# Patient Record
Sex: Male | Born: 1980 | Race: Black or African American | Hispanic: No | Marital: Single | Smoking: Current every day smoker
Health system: Southern US, Community
[De-identification: ages and names within clinical notes are randomized; demographics above are authoritative.]

## PROBLEM LIST (undated history)

## (undated) DIAGNOSIS — K08409 Partial loss of teeth, unspecified cause, unspecified class: Secondary | ICD-10-CM

---
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
## 2003-11-15 ENCOUNTER — Emergency Department (HOSPITAL_COMMUNITY): Admission: AD | Admit: 2003-11-15 | Discharge: 2003-11-16 | Payer: Self-pay | Admitting: Emergency Medicine

## 2004-03-24 ENCOUNTER — Emergency Department (HOSPITAL_COMMUNITY): Admission: EM | Admit: 2004-03-24 | Discharge: 2004-03-24 | Payer: Self-pay

## 2010-06-13 ENCOUNTER — Emergency Department (HOSPITAL_COMMUNITY): Admission: EM | Admit: 2010-06-13 | Discharge: 2010-06-13 | Payer: Self-pay | Admitting: Family Medicine

## 2011-01-08 LAB — GC/CHLAMYDIA PROBE AMP, GENITAL
Chlamydia, DNA Probe: NEGATIVE
GC Probe Amp, Genital: NEGATIVE

## 2011-04-30 ENCOUNTER — Emergency Department (HOSPITAL_COMMUNITY)
Admission: EM | Admit: 2011-04-30 | Discharge: 2011-04-30 | Disposition: A | Discharge status: Home / Self Care | Payer: Self-pay | Attending: Emergency Medicine | Admitting: Emergency Medicine

## 2011-04-30 DIAGNOSIS — S335XXA Sprain of ligaments of lumbar spine, initial encounter: Secondary | ICD-10-CM | POA: Insufficient documentation

## 2011-04-30 DIAGNOSIS — M545 Low back pain, unspecified: Secondary | ICD-10-CM | POA: Insufficient documentation

## 2011-04-30 DIAGNOSIS — X503XXA Overexertion from repetitive movements, initial encounter: Secondary | ICD-10-CM | POA: Insufficient documentation

## 2011-07-01 ENCOUNTER — Emergency Department (HOSPITAL_COMMUNITY): Payer: Self-pay

## 2011-07-01 ENCOUNTER — Emergency Department (HOSPITAL_COMMUNITY)
Admission: EM | Admit: 2011-07-01 | Discharge: 2011-07-02 | Disposition: A | Discharge status: Home / Self Care | Payer: Self-pay | Attending: Emergency Medicine | Admitting: Emergency Medicine

## 2011-07-01 DIAGNOSIS — M79609 Pain in unspecified limb: Secondary | ICD-10-CM | POA: Insufficient documentation

## 2011-07-01 DIAGNOSIS — S62329A Displaced fracture of shaft of unspecified metacarpal bone, initial encounter for closed fracture: Secondary | ICD-10-CM | POA: Insufficient documentation

## 2011-07-01 DIAGNOSIS — R609 Edema, unspecified: Secondary | ICD-10-CM | POA: Insufficient documentation

## 2011-07-01 DIAGNOSIS — W2209XA Striking against other stationary object, initial encounter: Secondary | ICD-10-CM | POA: Insufficient documentation

## 2012-01-12 ENCOUNTER — Ambulatory Visit: Payer: Self-pay | Admitting: Psychology

## 2012-09-27 ENCOUNTER — Encounter (HOSPITAL_COMMUNITY): Payer: Self-pay | Admitting: *Deleted

## 2012-09-27 ENCOUNTER — Emergency Department (HOSPITAL_COMMUNITY)
Admission: EM | Admit: 2012-09-27 | Discharge: 2012-09-27 | Disposition: A | Discharge status: Home / Self Care | Payer: Self-pay | Attending: Emergency Medicine | Admitting: Emergency Medicine

## 2012-09-27 DIAGNOSIS — B029 Zoster without complications: Secondary | ICD-10-CM | POA: Insufficient documentation

## 2012-09-27 DIAGNOSIS — F17200 Nicotine dependence, unspecified, uncomplicated: Secondary | ICD-10-CM | POA: Insufficient documentation

## 2012-09-27 DIAGNOSIS — Z7982 Long term (current) use of aspirin: Secondary | ICD-10-CM | POA: Insufficient documentation

## 2012-09-27 MED ORDER — ACYCLOVIR 800 MG PO TABS: 800.0000 mg | ORAL_TABLET | Freq: Every day | ORAL | Status: DC

## 2012-09-27 MED ORDER — OXYCODONE-ACETAMINOPHEN 5-325 MG PO TABS: 2.0000 | ORAL_TABLET | ORAL | Status: DC | PRN

## 2012-09-27 NOTE — ED Notes (Signed)
Pt reports having a knot in his groin region and now has a rash that extends across his groin and into his (L) buttock.  Pt reports itching and pain.  Pt reports that he got some prescription cream from a friend and has been using it-reports that the cream has made it worse.

## 2012-09-27 NOTE — ED Provider Notes (Signed)
History     CSN: [id number]  Arrival date & time 09/27/12  0900   First MD Initiated Contact with Patient 09/27/12 0915      Chief Complaint  Patient presents with  . Rash    (Consider location/radiation/quality/duration/timing/severity/associated sxs/prior treatment) HPI Comments: Zachory Mangual 31 y.o. male   The chief complaint is: Patient presents with:   Rash    Patient presents with Rash. Onset 3 days ago on his back. Began as a small ithcy, painful group of bumps.  The rash has spread and now wrapped down around the front of his right thigh. It is also present on the medial thigh.  He states that  It weeps. Patient is 78 and has had the chickenpox as a child.    Denies contacts with similar,changes in lotions/soaps/detergents, exposure to animal or plant irritants, and denies purulent discharge.  Denies fevers, chills, myalgias, arthralgias. Denies DOE, SOB, chest tightness or pressure, radiation to left arm, jaw or back, or diaphoresis. Denies dysuria, flank pain, suprapubic pain, frequency, urgency, or hematuria. Denies headaches, light headedness, weakness, visual disturbances. Denies abdominal pain, nausea, vomiting, diarrhea or constipation.        Patient is a 31 y.o. male presenting with rash. The history is provided by the patient. No language interpreter was used.  Rash  This is a new problem. The current episode started more than 2 days ago. The problem has been gradually worsening. There has been no fever. The rash is present on the back, right upper leg and groin. The pain is at a severity of 6/10. The pain is severe. The pain has been worsening since onset. Associated symptoms include blisters, itching, pain and weeping.    History reviewed. No pertinent past medical history.  History reviewed. No pertinent past surgical history.  History reviewed. No pertinent family history.  History  Substance Use Topics  . Smoking status: Current Every Day  Smoker -- 0.5 packs/day    Types: Cigarettes  . Smokeless tobacco: Not on file  . Alcohol Use: Yes     Comment: socially      Review of Systems  Skin: Positive for itching and rash.  With associated painful weeping blisters. Ten systems are reviewed and are negative for acute change except as noted in the HPI   Allergies  Review of patient's allergies indicates no known allergies.  Home Medications   Current Outpatient Rx  Name  Route  Sig  Dispense  Refill  . TRIAMCINOLONE ACETONIDE 0.1 % EX CREA   Topical   Apply 1 application topically 2 (two) times daily as needed. For irritation         . VITAMINS A & D EX OINT   Topical   Apply 1 application topically as needed. For irritation         . ACYCLOVIR 800 MG PO TABS   Oral   Take 1 tablet (800 mg total) by mouth 5 (five) times daily.   50 tablet   0   . OXYCODONE-ACETAMINOPHEN 5-325 MG PO TABS   Oral   Take 2 tablets by mouth every 4 (four) hours as needed for pain.   20 tablet   0     BP 126/81  Pulse 88  Temp 98.2 F (36.8 C)  Resp 18  SpO2 96%  Physical Exam Physical Exam  Nursing note and vitals reviewed. Constitutional: He appears well-developed and well-nourished. No distress.  HENT:  Head: Normocephalic and atraumatic.  Eyes: Conjunctivae normal are  normal. No scleral icterus.  Neck: Normal range of motion. Neck supple.  Cardiovascular: Normal rate, regular rhythm and normal heart sounds.   Pulmonary/Chest: Effort normal and breath sounds normal. No respiratory distress.  Abdominal: Soft. There is no tenderness.  Musculoskeletal: He exhibits no edema.  Neurological: He is alert.  Skin: Skin is warm and dry. He is not diaphoretic. Positive for vesicles/confluent bullae, crusting, weeping rash in the L1-L4 distribution from the midback to the  right thigh. Psychiatric: His behavior is normal.     ED Course  Procedures (including critical care time)  Labs Reviewed - No data to  display No results found.   1. Shingles outbreak       MDM  BP 126/81  Pulse 88  Temp 98.2 F (36.8 C)  Resp 18  SpO2 96% Patient with PE and hx consistent with herpes zoster.  I will treat with pain control and acyclovir. There does not appear to be secondary bacterial infection.  I have warned the patient of these symptoms and prevention of spread including pregnant females and elderly. Patient will return in 2 days for wound check.  Discussed reasons to seek immediate care. Patient expresses understanding and agrees with plan.         Arthor Captain, PA-C 09/29/12 [phone]

## 2012-09-27 NOTE — ED Notes (Addendum)
Pt presents to department for evaluation of rash to R buttock, lower back and R inner thigh area. Ongoing x1 week. Pt states severe itching and discomfort to area. Also states "blisters" with clear drainage. Pt states use of friend's prescription cream, but no relief from pain. He is alert and oriented x4. No signs of acute distress noted.

## 2012-10-13 NOTE — ED Provider Notes (Signed)
Medical screening examination/treatment/procedure(s) were performed by non-physician practitioner and as supervising physician I was immediately available for consultation/collaboration.  Tobin Chad, MD 10/13/12 [phone]

## 2013-03-28 IMAGING — CR DG HAND COMPLETE 3+V*R*
3 series · 3 of 3 positions shown · non-contrast
Comparison: None.

CLINICAL DATA: Right hand pain around the fifth metacarpal after
striking [place] with fist.

RIGHT HAND - COMPLETE 3+ VIEW

[x hand pa right]
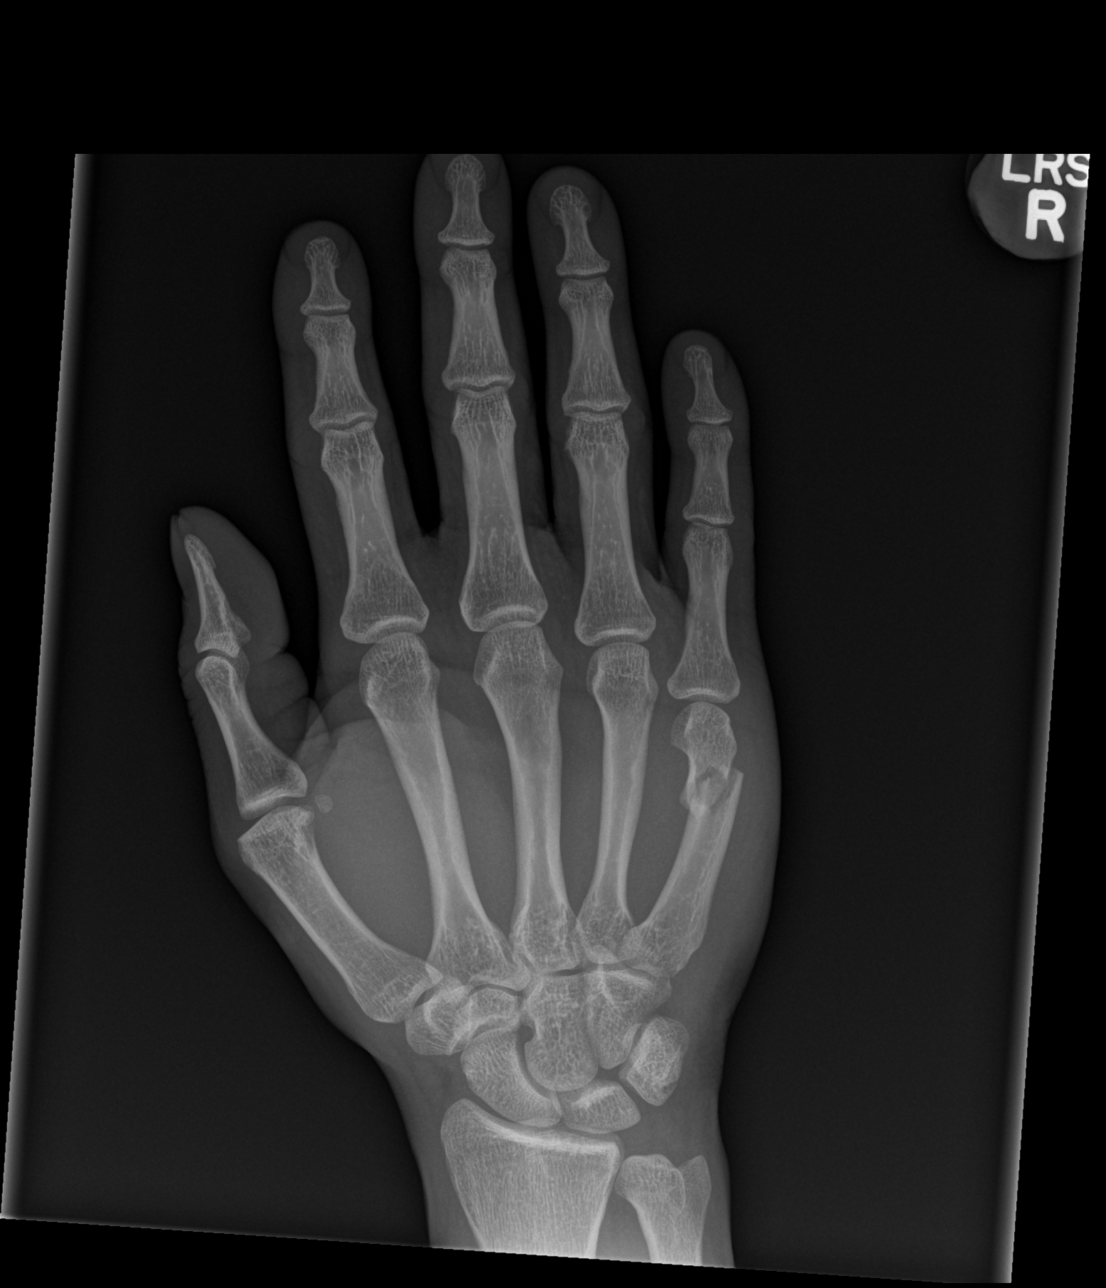

[x hand obl right]
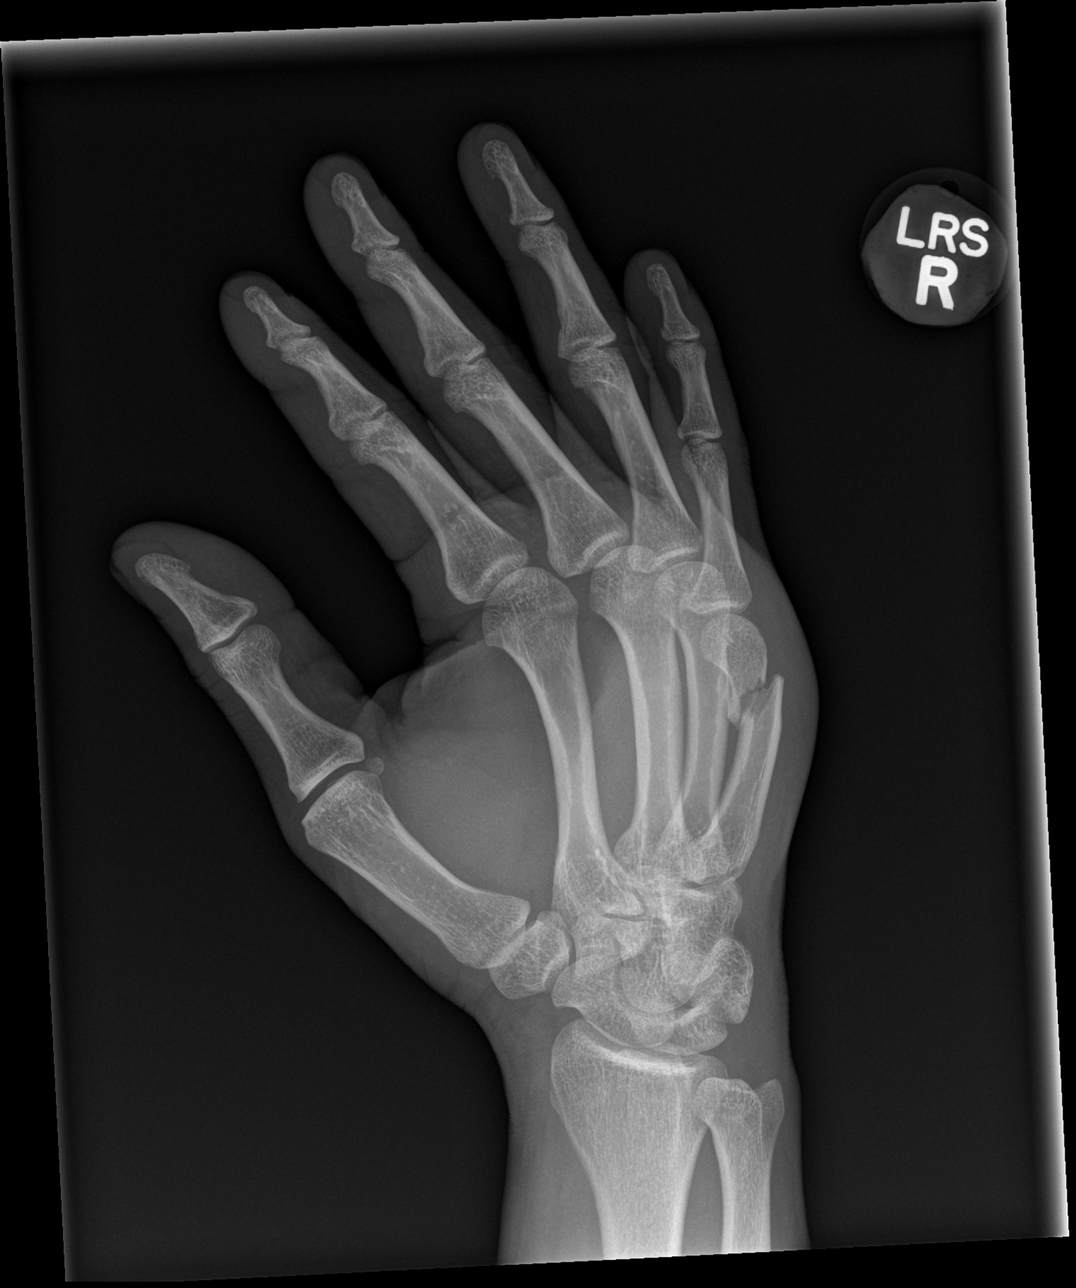

[x hand lat right]
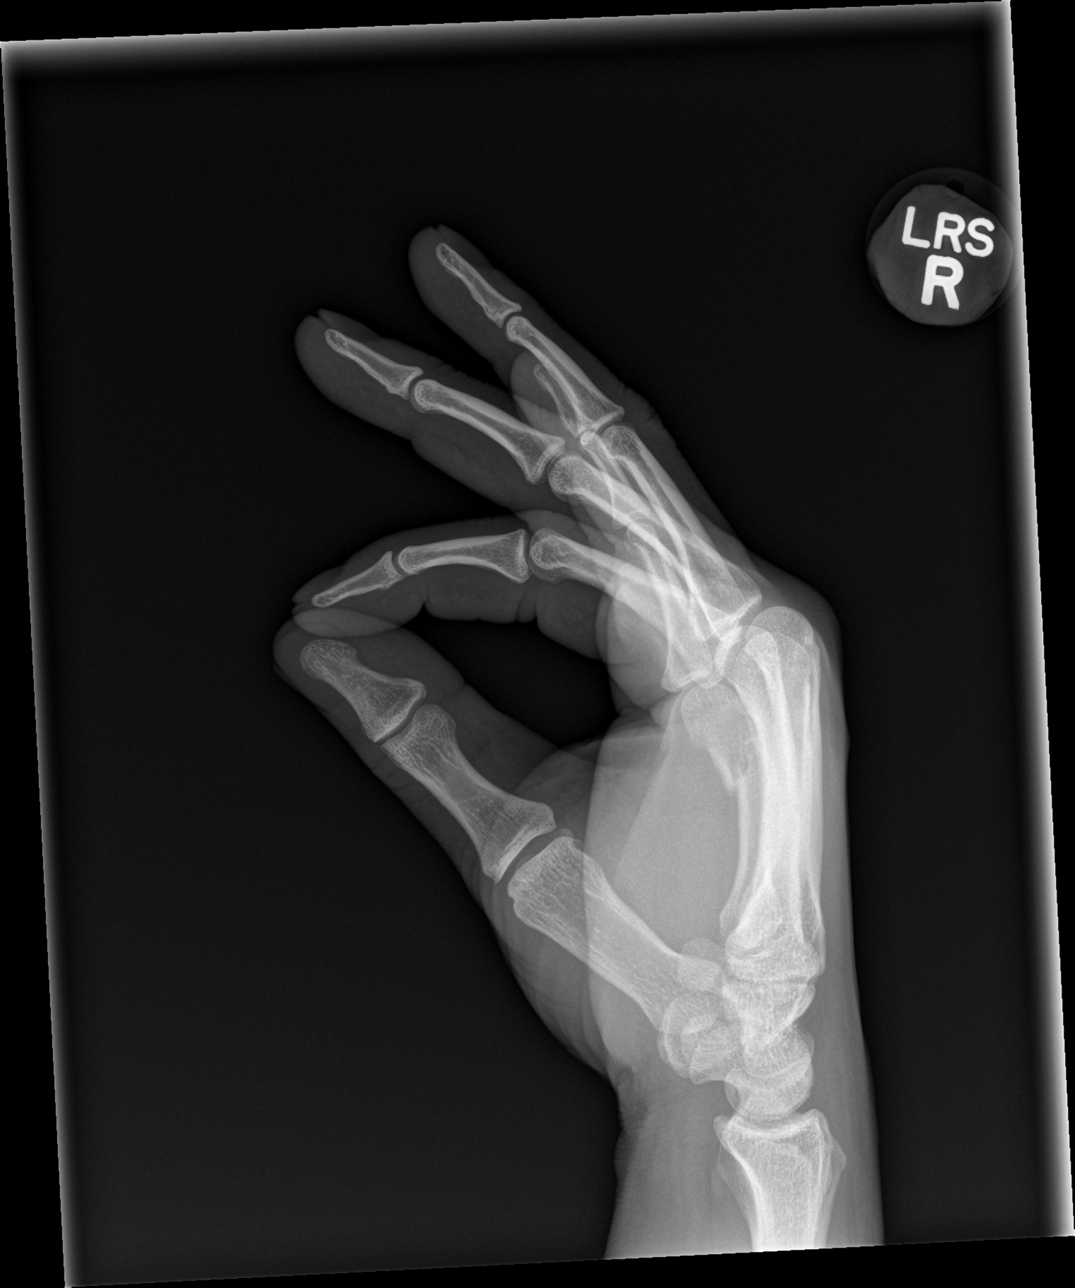

[3 of 3 positions shown; findings below may reference images not displayed]

FINDINGS: Minimally oblique fracture of the distal right fifth
metacarpal shaft with mild volar displacement and angulation.
Overlying soft tissue swelling.  No other fractures demonstrated.
No focal bone lesions.
IMPRESSION: Acute boxer's type fracture of the right fifth metacarpal bone.

## 2018-10-02 ENCOUNTER — Emergency Department (HOSPITAL_COMMUNITY)
Admission: EM | Admit: 2018-10-02 | Discharge: 2018-10-02 | Disposition: A | Discharge status: Home / Self Care | Payer: Self-pay | Attending: Emergency Medicine | Admitting: Emergency Medicine

## 2018-10-02 ENCOUNTER — Emergency Department (HOSPITAL_COMMUNITY): Payer: Self-pay

## 2018-10-02 ENCOUNTER — Encounter (HOSPITAL_COMMUNITY): Payer: Self-pay | Admitting: Emergency Medicine

## 2018-10-02 DIAGNOSIS — W230XXA Caught, crushed, jammed, or pinched between moving objects, initial encounter: Secondary | ICD-10-CM | POA: Insufficient documentation

## 2018-10-02 DIAGNOSIS — Y998 Other external cause status: Secondary | ICD-10-CM | POA: Insufficient documentation

## 2018-10-02 DIAGNOSIS — F17210 Nicotine dependence, cigarettes, uncomplicated: Secondary | ICD-10-CM | POA: Insufficient documentation

## 2018-10-02 DIAGNOSIS — Y929 Unspecified place or not applicable: Secondary | ICD-10-CM | POA: Insufficient documentation

## 2018-10-02 DIAGNOSIS — Z79899 Other long term (current) drug therapy: Secondary | ICD-10-CM | POA: Insufficient documentation

## 2018-10-02 DIAGNOSIS — S62346A Nondisplaced fracture of base of fifth metacarpal bone, right hand, initial encounter for closed fracture: Secondary | ICD-10-CM | POA: Insufficient documentation

## 2018-10-02 DIAGNOSIS — S62342A Nondisplaced fracture of base of third metacarpal bone, right hand, initial encounter for closed fracture: Secondary | ICD-10-CM | POA: Insufficient documentation

## 2018-10-02 DIAGNOSIS — Y939 Activity, unspecified: Secondary | ICD-10-CM | POA: Insufficient documentation

## 2018-10-02 MED ORDER — IBUPROFEN 800 MG PO TABS: 800.0000 mg | ORAL_TABLET | Freq: Three times a day (TID) | ORAL | 0 refills | Status: AC

## 2018-10-02 MED ORDER — IBUPROFEN 800 MG PO TABS
800.0000 mg | ORAL_TABLET | Freq: Once | ORAL | Status: AC
Start: 2018-10-02 — End: 2018-10-02
  Administered 2018-10-02: 800 mg via ORAL
  Filled 2018-10-02: qty 1

## 2018-10-02 NOTE — ED Triage Notes (Signed)
Patient presents with left hand pain/swelling injured yesterday from a car door , skin intact /pain increases with movement /palpation .

## 2018-10-02 NOTE — Discharge Instructions (Addendum)
1. Medications: alternate ibuprofen and tylenol for pain control, usual home medications °2. Treatment: rest, ice, elevate and use brace, drink plenty of fluids, gentle stretching °3. Follow Up: Please followup with orthopedics as directed for discussion of your diagnoses and further evaluation after today's visit; if you do not have a primary care doctor use the resource guide provided to find one; Please return to the ER for worsening symptoms or other concerns ° ° °

## 2018-10-02 NOTE — ED Notes (Signed)
Ortho tech paged for pt.'s splint.

## 2018-10-02 NOTE — ED Provider Notes (Signed)
[facility] EMERGENCY DEPARTMENT Provider Note   CSN: [id number] Arrival date & time: 10/02/18  0241     History   Chief Complaint Chief Complaint  Patient presents with  . Hand Injury    HPI Raymond Gates is a 37 y.o. male with no major medical problems presents to the Emergency Department complaining of acute, persistent, left hand pain onset 24 hours ago after closing it in a door.  Pt reports his pain is an 8/10 and does not radiate; pain is sharp.  Pt reports motrin without significant relief.  Pt reports movement and palpation make the symptoms worse.  Pt denies punching anything or fight bite..     The history is provided by the patient and medical records. No language interpreter was used.    History reviewed. No pertinent past medical history.  There are no active problems to display for this patient.   History reviewed. No pertinent surgical history.      Home Medications    Prior to Admission medications   Medication Sig Start Date End Date Taking? Authorizing Provider  acyclovir (ZOVIRAX) 800 MG tablet Take 1 tablet (800 mg total) by mouth 5 (five) times daily. 09/27/12   Harris, Cammy Copa, PA-C  ibuprofen (ADVIL,MOTRIN) 800 MG tablet Take 1 tablet (800 mg total) by mouth 3 (three) times daily. 10/02/18   Doss Cybulski, Dahlia Client, PA-C  oxyCODONE-acetaminophen (PERCOCET) 5-325 MG per tablet Take 2 tablets by mouth every 4 (four) hours as needed for pain. 09/27/12   Arthor Captain, PA-C  triamcinolone cream (KENALOG) 0.1 % Apply 1 application topically 2 (two) times daily as needed. For irritation    [provider]  Vitamins A & D (VITAMIN A & D) ointment Apply 1 application topically as needed. For irritation    [provider]    Family History No family history on file.  Social History Social History   Tobacco Use  . Smoking status: Current Every Day Smoker    Packs/day: 0.50    Types: Cigarettes  Substance Use Topics    . Alcohol use: Yes    Comment: socially  . Drug use: No     Allergies   Patient has no known allergies.   Review of Systems Review of Systems  Constitutional: Negative for chills and fever.  Gastrointestinal: Negative for nausea and vomiting.  Musculoskeletal: Positive for arthralgias and joint swelling. Negative for back pain, neck pain and neck stiffness.  Skin: Negative for wound.  Neurological: Negative for numbness.  Hematological: Does not bruise/bleed easily.  Psychiatric/Behavioral: The patient is not nervous/anxious.   All other systems reviewed and are negative.    Physical Exam Updated Vital Signs BP (!) 147/69 (BP Location: Right Arm)   Pulse 85   Temp 98.3 F (36.8 C) (Oral)   Resp 16   Ht 6\' 2"  (1.88 m)   Wt 77.1 kg   SpO2 98%   BMI 21.83 kg/m   Physical Exam  Constitutional: He appears well-developed and well-nourished. No distress.  HENT:  Head: Normocephalic and atraumatic.  Eyes: Conjunctivae are normal.  Neck: Normal range of motion.  Cardiovascular: Normal rate, regular rhythm and intact distal pulses.  Capillary refill < 3 sec  Pulmonary/Chest: Effort normal and breath sounds normal.  Musculoskeletal: He exhibits tenderness. He exhibits no edema.  Range of motion of all DIP, PIP and MCPs of the right hand.  Patient does report this exacerbates pain.  Significant swelling and some ecchymosis to the dorsum of the  hand.  No joint line tenderness to the wrist.  Full range of motion of the wrist without pain.  Neurological: He is alert. Coordination normal.  Sensation intact to normal touch throughout the right hand Strength 5/5 with flexion extension of all fingers of the right hand however this causes significant pain at the base of the metacarpals.  Skin: Skin is warm and dry. He is not diaphoretic.  No tenting of the skin  Psychiatric: He has a normal mood and affect.  Nursing note and vitals reviewed.    ED Treatments / Results    Radiology Dg Hand Complete Left  Result Date: 10/02/2018 CLINICAL DATA:  Initial evaluation for acute pain with swelling, trauma. EXAM: LEFT HAND - COMPLETE 3+ VIEW COMPARISON:  None. FINDINGS: Osseous fragment at the base of the left fifth metacarpal, suspicious for an acute minimally displaced fracture. Additional probable tiny acute minimally displaced fracture at the base of the left third metacarpal. No other acute fracture or dislocation. Joint spaces maintained. Mild soft tissue swelling overlies the mid hand. IMPRESSION: 1. Osseous fragment at the base of the left fifth metacarpal, suspicious for an acute minimally displaced fracture. 2. Additional probable tiny acute minimally displaced fracture at the base of the left third metacarpal. Electronically Signed   By: Rise MuBenjamin  McClintock M.D.   On: 10/02/2018 03:33    Procedures .Splint Application Date/Time: 10/02/2018 5:05 AM Performed by: Dierdre ForthMuthersbaugh, Natika Geyer, PA-C Authorized by: Dierdre ForthMuthersbaugh, Deion Swift, PA-C   Consent:    Consent obtained:  Verbal   Consent given by:  Patient   Risks discussed:  Discoloration, numbness, pain and swelling   Alternatives discussed:  No treatment, delayed treatment, alternative treatment, observation and referral Pre-procedure details:    Sensation:  Normal   Skin color:  Flesh Procedure details:    Laterality:  Left   Location:  Hand   Hand:  L hand   Cast type:  Short arm   Splint type:  Ulnar gutter   Supplies:  Ortho-Glass Post-procedure details:    Pain:  Improved   Sensation:  Normal   Skin color:  Flesh   Patient tolerance of procedure:  Tolerated well, no immediate complications   (including critical care time)  Medications Ordered in ED Medications  ibuprofen (ADVIL,MOTRIN) tablet 800 mg (800 mg Oral Given 10/02/18 0505)     Initial Impression / Assessment and Plan / ED Course  I have reviewed the triage vital signs and the nursing notes.  Pertinent labs & imaging results  that were available during my care of the patient were reviewed by me and considered in my medical decision making (see chart for details).     Patient X-Ray with small, nondisplaced fractures to the third and fifth metacarpal.  I personally evaluated these images.. Pain managed in ED. Pt advised to follow up with orthopedics for further evaluation and treatment.  Pain managed in the department. Patient given gutter splint while in ED, conservative therapy recommended and discussed. Patient will be dc home & is agreeable with above plan. I have also discussed reasons to return immediately to the ER.  Patient expresses understanding and agrees with plan.    Final Clinical Impressions(s) / ED Diagnoses   Final diagnoses:  Closed nondisplaced fracture of base of third metacarpal bone of right hand, initial encounter  Closed nondisplaced fracture of base of fifth metacarpal bone of right hand, initial encounter    ED Discharge Orders         Ordered  ibuprofen (ADVIL,MOTRIN) 800 MG tablet  3 times daily     10/02/18 0426           Dee Paden, Dahlia Client, PA-C 10/02/18 0506    Glynn Octave, MD 10/02/18 [phone]

## 2018-10-08 ENCOUNTER — Other Ambulatory Visit: Payer: Self-pay

## 2018-10-08 ENCOUNTER — Emergency Department (HOSPITAL_COMMUNITY)
Admission: EM | Admit: 2018-10-08 | Discharge: 2018-10-09 | Disposition: A | Discharge status: Home / Self Care | Payer: Self-pay | Attending: Emergency Medicine | Admitting: Emergency Medicine

## 2018-10-08 DIAGNOSIS — Z139 Encounter for screening, unspecified: Secondary | ICD-10-CM | POA: Insufficient documentation

## 2018-10-09 ENCOUNTER — Emergency Department (HOSPITAL_COMMUNITY): Admission: EM | Admit: 2018-10-09 | Discharge: 2018-10-09 | Discharge status: Against Medical Advice | Payer: Self-pay

## 2018-10-09 NOTE — ED Provider Notes (Signed)
[facility] EMERGENCY DEPARTMENT Provider Note   CSN: [id number] Arrival date & time: 10/08/18  2344     History   Chief Complaint Chief Complaint  Patient presents with  . Hand Injury    HPI Raymond Gates is a 37 y.o. male.  The history is provided by the patient and medical records.  Hand Injury       37 year old male here requesting work note.  Seen in the ED on 10/02/18 due to left hand injury.  He states he has been out of work since then because he cannot type on computer and needs note to return to work in the morning.  Denies any ongoing issues with his hand, still wearing splint as directed.  No past medical history on file.  There are no active problems to display for this patient.   No past surgical history on file.      Home Medications    Prior to Admission medications   Medication Sig Start Date End Date Taking? Authorizing Provider  acyclovir (ZOVIRAX) 800 MG tablet Take 1 tablet (800 mg total) by mouth 5 (five) times daily. 09/27/12   Harris, Cammy Copa, PA-C  ibuprofen (ADVIL,MOTRIN) 800 MG tablet Take 1 tablet (800 mg total) by mouth 3 (three) times daily. 10/02/18   Muthersbaugh, Dahlia Client, PA-C  oxyCODONE-acetaminophen (PERCOCET) 5-325 MG per tablet Take 2 tablets by mouth every 4 (four) hours as needed for pain. 09/27/12   Arthor Captain, PA-C  triamcinolone cream (KENALOG) 0.1 % Apply 1 application topically 2 (two) times daily as needed. For irritation    [provider]  Vitamins A & D (VITAMIN A & D) ointment Apply 1 application topically as needed. For irritation    [provider]    Family History No family history on file.  Social History Social History   Tobacco Use  . Smoking status: Current Every Day Smoker    Packs/day: 0.50    Types: Cigarettes  Substance Use Topics  . Alcohol use: Yes    Comment: socially  . Drug use: No     Allergies   Patient has no known allergies.   Review of  Systems Review of Systems  Constitutional:       Work note  All other systems reviewed and are negative.    Physical Exam Updated Vital Signs BP 124/73 (BP Location: Right Arm)   Pulse 85   Temp 97.9 F (36.6 C) (Oral)   Resp 16   SpO2 100%   Physical Exam Vitals signs and nursing note reviewed.  Constitutional:      Appearance: He is well-developed.  HENT:     Head: Normocephalic and atraumatic.  Eyes:     Conjunctiva/sclera: Conjunctivae normal.     Pupils: Pupils are equal, round, and reactive to light.  Neck:     Musculoskeletal: Normal range of motion.  Cardiovascular:     Rate and Rhythm: Normal rate and regular rhythm.     Heart sounds: Normal heart sounds.  Pulmonary:     Effort: Pulmonary effort is normal.     Breath sounds: Normal breath sounds.  Abdominal:     General: Bowel sounds are normal.     Palpations: Abdomen is soft.  Musculoskeletal: Normal range of motion.     Comments: Left hand in ulnar-gutter splint, moving fingers normally  Skin:    General: Skin is warm and dry.  Neurological:     Mental Status: He is alert and oriented to person,  place, and time.      ED Treatments / Results  Labs (all labs ordered are listed, but only abnormal results are displayed) Labs Reviewed - No data to display  EKG None  Radiology No results found.  Procedures Procedures (including critical care time)  Medications Ordered in ED Medications - No data to display   Initial Impression / Assessment and Plan / ED Course  I have reviewed the triage vital signs and the nursing notes.  Pertinent labs & imaging results that were available during my care of the patient were reviewed by me and considered in my medical decision making (see chart for details).  37 year old male here requesting work note for last week.  He was seen in the ED on 10/02/2018 due to left hand injury and states he has been out of work for the past week as he was unable to have on  computer.  He denies any ongoing issues with his hand, still been wearing splint as directed.  Advised I cannot backdate work note for last week but he was given note to return to work in the morning.  He can follow-up with orthopedics as recommended at prior visit.  He will return here for any new or worsening symptoms.  Final Clinical Impressions(s) / ED Diagnoses   Final diagnoses:  Encounter for medical screening examination    ED Discharge Orders    None       Garlon HatchetSanders, Kincade Granberg M, PA-C 10/09/18 0010    Derwood KaplanNanavati, Ankit, MD 10/09/18 [phone]

## 2019-02-13 ENCOUNTER — Ambulatory Visit (HOSPITAL_COMMUNITY)
Admission: EM | Admit: 2019-02-13 | Discharge: 2019-02-13 | Disposition: A | Discharge status: Home / Self Care | Payer: Self-pay | Attending: Family Medicine | Admitting: Family Medicine

## 2019-02-13 ENCOUNTER — Other Ambulatory Visit: Payer: Self-pay

## 2019-02-13 ENCOUNTER — Encounter (HOSPITAL_COMMUNITY): Payer: Self-pay | Admitting: Emergency Medicine

## 2019-02-13 DIAGNOSIS — L84 Corns and callosities: Secondary | ICD-10-CM

## 2019-02-13 NOTE — ED Triage Notes (Signed)
Bilateral heel pain to both feet.  Pain for 2 months.

## 2019-02-19 ENCOUNTER — Ambulatory Visit (HOSPITAL_COMMUNITY)
Admission: EM | Admit: 2019-02-19 | Discharge: 2019-02-19 | Disposition: A | Discharge status: Home / Self Care | Payer: Self-pay | Attending: Family Medicine | Admitting: Family Medicine

## 2019-02-19 ENCOUNTER — Other Ambulatory Visit: Payer: Self-pay

## 2019-02-19 ENCOUNTER — Encounter (HOSPITAL_COMMUNITY): Payer: Self-pay

## 2019-02-19 DIAGNOSIS — L84 Corns and callosities: Secondary | ICD-10-CM

## 2019-02-19 NOTE — ED Provider Notes (Signed)
[facility]    CSN: [id number] Arrival date & time: 02/19/19  1806     History   Chief Complaint Chief Complaint  Patient presents with  . work note    HPI Raymond Gates is a 38 y.o. male.   Raymond Gates presents with complaints of persistent pain to his right heel. He hasn't yet been able to follow up with podiatry as he did not have the required payment. Pain is worse while at work with any pressure to the heel. He has been applying over the counter callus removal to the heel which has minimally helped. No drainage. No sores or lesions. Denies any previous similar. 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 No swelling. Without contributing medical history.      ROS per HPI, negative if not otherwise mentioned.      History reviewed. No pertinent past medical history.  There are no active problems to display for this patient.   History reviewed. No pertinent surgical history.     Home Medications    Prior to Admission medications   Medication Sig Start Date End Date Taking? Authorizing Provider  ibuprofen (ADVIL,MOTRIN) 800 MG tablet Take 1 tablet (800 mg total) by mouth 3 (three) times daily. 10/02/18   Muthersbaugh, Dahlia Client, PA-C    Family History Family History  Problem Relation Age of Onset  . Healthy Mother   . Healthy Father     Social History Social History   Tobacco Use  . Smoking status: Current Every Day Smoker    Packs/day: 0.50    Types: Cigarettes  . Smokeless tobacco: Never Used  Substance Use Topics  . Alcohol use: Yes    Comment: socially  . Drug use: No     Allergies   Patient has no known allergies.   Review of Systems Review of Systems   Physical Exam Triage Vital Signs ED Triage Vitals  Enc Vitals Group     BP 02/19/19 1837 120/69     Pulse Rate 02/19/19 1837 80     Resp 02/19/19 1837 16     Temp 02/19/19 1837 98.5 F (36.9 C)     Temp  Source 02/19/19 1837 Oral     SpO2 02/19/19 1837 99 %     Weight 02/19/19 1840 170 lb (77.1 kg)     Height --      Head Circumference --      Peak Flow --      Pain Score 02/19/19 1840 6     Pain Loc --      Pain Edu? --      Excl. in GC? --    No data found.  Updated Vital Signs BP 120/69 (BP Location: Right Arm)   Pulse 80   Temp 98.5 F (36.9 C) (Oral)   Resp 16   Wt 170 lb (77.1 kg)   SpO2 99%   BMI 21.83 kg/m   Visual Acuity Right Eye Distance:   Left Eye Distance:   Bilateral Distance:    Right Eye Near:   Left Eye Near:    Bilateral Near:     Physical Exam Constitutional:      Appearance: He is well-developed.  Cardiovascular:     Rate and Rhythm: Normal rate and regular rhythm.  Pulmonary:     Effort: Pulmonary effort is normal.     Breath sounds: Normal breath sounds.  Feet:  Right foot:     Skin integrity: Callus present.     Comments: Right plantar aspect of heel with large callused skin; currently with OTC cream and pad on the area; tender; no fluctuance or redness; no swelling or streaking  Skin:    General: Skin is warm and dry.  Neurological:     Mental Status: He is alert and oriented to person, place, and time.      UC Treatments / Results  Labs (all labs ordered are listed, but only abnormal results are displayed) Labs Reviewed - No data to display  EKG None  Radiology No results found.  Procedures Procedures (including critical care time)  Medications Ordered in UC Medications - No data to display  Initial Impression / Assessment and Plan / UC Course  I have reviewed the triage vital signs and the nursing notes.  Pertinent labs & imaging results that were available during my care of the patient were reviewed by me and considered in my medical decision making (see chart for details).     Continue with over the counter treatments as needed. Question if friction from poor footwear is contributing. Discussed with patient.  Encouraged podiatry follow up as able. Patient verbalized understanding and agreeable to plan.  Ambulatory out of clinic without difficulty.    Final Clinical Impressions(s) / UC Diagnoses   Final diagnoses:  Callus of heel     Discharge Instructions     Please follow up with podiatry for definitive treatment. Avoid friction to the affected area     ED Prescriptions    None     Controlled Substance Prescriptions [place] Controlled Substance Registry consulted? Not Applicable   Georgetta HaberBurky, Natalie B, NP 02/19/19 1910

## 2019-02-19 NOTE — Discharge Instructions (Signed)
Please follow up with podiatry for definitive treatment. Avoid friction to the affected area

## 2019-02-19 NOTE — ED Triage Notes (Signed)
Pt states he needs a work note for his foot pain.Marland Kitchen

## 2019-02-23 ENCOUNTER — Ambulatory Visit: Payer: Self-pay | Admitting: Podiatry

## 2019-02-27 NOTE — ED Provider Notes (Signed)
[facility]   [id number] 02/13/19 Arrival Time: 1634  ASSESSMENT & PLAN:  1. Callus of foot    He really needs to see a podiatrist/foot specialist. He prefers to self-schedule.   Follow-up Information    Schedule an appointment as soon as possible for a visit  with [facility].   Contact information: [address] [place] [address] [phone]          Reviewed expectations re: course of current medical issues. Questions answered. Outlined signs and symptoms indicating need for more acute intervention. Patient verbalized understanding. After Visit Summary given.  SUBJECTIVE: History from: patient. Raymond Gates is a 38 y.o. male who reports fairly persistent pain to his feet; specifically over R heel and near L great toe. Gradually has noticed over the past two months. No new shoes. Stands a lot at work. No swelling. No extremity sensation changes or weakness. No OTC tx. Ambulatory without difficulty. No specific aggravating or alleviating factors reported. No injury/trauma reported. History of similar: no.  History reviewed. No pertinent surgical history.   ROS: As per HPI.   OBJECTIVE:  Vitals:   02/13/19 1740  BP: 107/66  Pulse: 70  Resp: 18  Temp: 98.8 F (37.1 C)  TempSrc: Oral  SpO2: 96%    General appearance: alert; no distress Extremities: . Bilateral lower extremities: thick callouses over feet; no swelling or bruising; no signs of cellulitis; skin without erythema; normal sensation of bilateral lower extremities CV: brisk extremity capillary refill of RLE and LLE; 2+ DP and PT pulse of RLE and LLE. Skin: warm and dry; no visible rashes Neurologic: gait normal; normal reflexes of RLE and LLE; normal sensation of RLE and LLE; normal strength of RLE and LLE Psychological: alert and cooperative; normal mood and affect  No Known Allergies  Social History   Socioeconomic History  . Marital status: Single    Spouse  name: Not on file  . Number of children: Not on file  . Years of education: Not on file  . Highest education level: Not on file  Occupational History  . Not on file  Social Needs  . Financial resource strain: Not on file  . Food insecurity:    Worry: Not on file    Inability: Not on file  . Transportation needs:    Medical: Not on file    Non-medical: Not on file  Tobacco Use  . Smoking status: Current Every Day Smoker    Packs/day: 0.50    Types: Cigarettes  . Smokeless tobacco: Never Used  Substance and Sexual Activity  . Alcohol use: Yes    Comment: socially  . Drug use: No  . Sexual activity: Not on file  Lifestyle  . Physical activity:    Days per week: Not on file    Minutes per session: Not on file  . Stress: Not on file  Relationships  . Social connections:    Talks on phone: Not on file    Gets together: Not on file    Attends religious service: Not on file    Active member of club or organization: Not on file    Attends meetings of clubs or organizations: Not on file    Relationship status: Not on file  Other Topics Concern  . Not on file  Social History Narrative  . Not on file   Family History  Problem Relation Age of Onset  . Healthy Mother   . Healthy Father  History reviewed. No pertinent surgical history.    Mardella LaymanHagler, Ardie Mclennan, MD 02/27/19 [phone]

## 2019-03-02 ENCOUNTER — Other Ambulatory Visit: Payer: Self-pay

## 2019-03-02 ENCOUNTER — Encounter (HOSPITAL_COMMUNITY): Payer: Self-pay

## 2019-03-02 ENCOUNTER — Ambulatory Visit (HOSPITAL_COMMUNITY)
Admission: EM | Admit: 2019-03-02 | Discharge: 2019-03-02 | Disposition: A | Discharge status: Home / Self Care | Payer: Self-pay | Attending: Internal Medicine | Admitting: Internal Medicine

## 2019-03-02 DIAGNOSIS — L84 Corns and callosities: Secondary | ICD-10-CM

## 2019-03-02 NOTE — ED Provider Notes (Signed)
[facility]    CSN: [id number] Arrival date & time: 03/02/19  1803     History   Chief Complaint Chief Complaint  Patient presents with  . Letter for School/Work    HPI Raymond Gates is a 38 y.o. male with a history of callus on both heels currently under podiatry care comes to urgent care for work note.  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  Patient was seen by podiatry last week for cryotherapy of both heels and is due for another treatment next week  HPI  History reviewed. No pertinent past medical history.  There are no active problems to display for this patient.   History reviewed. No pertinent surgical history.     Home Medications    Prior to Admission medications   Medication Sig Start Date End Date Taking? Authorizing Provider  ibuprofen (ADVIL,MOTRIN) 800 MG tablet Take 1 tablet (800 mg total) by mouth 3 (three) times daily. 10/02/18   Muthersbaugh, Dahlia ClientHannah, PA-C    Family History Family History  Problem Relation Age of Onset  . Healthy Mother   . Healthy Father     Social History Social History   Tobacco Use  . Smoking status: Current Every Day Smoker    Types: Cigarettes  . Smokeless tobacco: Never Used  . Tobacco comment: 1-2 cigarettes per day  Substance Use Topics  . Alcohol use: Yes    Comment: socially  . Drug use: No     Allergies   Patient has no known allergies.   Review of Systems Review of Systems  Constitutional: Negative for activity change and appetite change.  Musculoskeletal: Negative for arthralgias and back pain.  Skin: Negative for pallor and rash.  Neurological: Negative for dizziness, weakness and numbness.     Physical Exam Triage Vital Signs ED Triage Vitals  Enc Vitals Group     BP 03/02/19 1816 118/81     Pulse Rate 03/02/19 1816 79     Resp 03/02/19 1816 18     Temp 03/02/19 1816 98.2 F (36.8 C)     Temp Source 03/02/19  1816 Oral     SpO2 03/02/19 1816 100 %     Weight --      Height --      Head Circumference --      Peak Flow --      Pain Score 03/02/19 1815 6     Pain Loc --      Pain Edu? --      Excl. in GC? --    No data found.  Updated Vital Signs BP 118/81 (BP Location: Right Arm)   Pulse 79   Temp 98.2 F (36.8 C) (Oral)   Resp 18   SpO2 100%   Visual Acuity Right Eye Distance:   Left Eye Distance:   Bilateral Distance:    Right Eye Near:   Left Eye Near:    Bilateral Near:     Physical Exam Constitutional:      Appearance: Normal appearance.  Cardiovascular:     Rate and Rhythm: Normal rate and regular rhythm.  Musculoskeletal: Normal range of motion.        General: Swelling and tenderness present.  Skin:    General: Skin is warm.     Capillary Refill: Capillary refill takes less than 2 seconds.  Neurological:     General: No focal deficit present.  Mental Status: He is alert.      UC Treatments / Results  Labs (all labs ordered are listed, but only abnormal results are displayed) Labs Reviewed - No data to display  EKG None  Radiology No results found.  Procedures Procedures (including critical care time)  Medications Ordered in UC Medications - No data to display  Initial Impression / Assessment and Plan / UC Course  I have reviewed the triage vital signs and the nursing notes.  Pertinent labs & imaging results that were available during my care of the patient were reviewed by me and considered in my medical decision making (see chart for details).     1.  Return to work evaluation: Work note was given  2.  Bilateral heel calluses status post cryotherapy: Follow-up with podiatry  Final Clinical Impressions(s) / UC Diagnoses   Final diagnoses:  Callus of foot   Discharge Instructions   None    ED Prescriptions    None     Controlled Substance Prescriptions [place] Controlled Substance Registry consulted? No   Merrilee Jansky, MD  03/02/19 [phone]

## 2019-03-02 NOTE — ED Triage Notes (Signed)
Patient presents to Urgent Care with complaints of needing a note for work today since he has foot pain and has been seen for it here in the past.

## 2019-03-12 ENCOUNTER — Other Ambulatory Visit: Payer: Self-pay

## 2019-03-12 ENCOUNTER — Ambulatory Visit (HOSPITAL_COMMUNITY)
Admission: EM | Admit: 2019-03-12 | Discharge: 2019-03-12 | Disposition: A | Discharge status: Home / Self Care | Payer: Self-pay | Attending: Internal Medicine | Admitting: Internal Medicine

## 2019-03-12 ENCOUNTER — Encounter (HOSPITAL_COMMUNITY): Payer: Self-pay | Admitting: Emergency Medicine

## 2019-03-12 DIAGNOSIS — B079 Viral wart, unspecified: Secondary | ICD-10-CM

## 2019-03-12 HISTORY — DX: Partial loss of teeth, unspecified cause, unspecified class: K08.409

## 2019-03-12 NOTE — Discharge Instructions (Signed)
Warts and calluses on the soles of the feet are a difficult issue.  May require treatment every couple of weeks for as long as several months, which can be uncomfortable.  No danger signs on exam.  Might try some moleskin cushions from the drugstore, can cut out centers of the pad to relieve pressure right over the sore spots.  Followup with a foot doctor to guide further treatment.  Note for work tomorrow.

## 2019-03-12 NOTE — ED Triage Notes (Signed)
Patient has areas to right heel that he has been using otc wort remover on this area.  Heel hurts, now.

## 2019-03-12 NOTE — ED Provider Notes (Signed)
[facility]    CSN: [id number] Arrival date & time: 03/12/19  1809     History   Chief Complaint Chief Complaint  Patient presents with  . Foot Pain    HPI Raymond Gates is a 38 y.o. male. He presents today with pain in the right heel.  He has several lesions on the sole of the right heel consistent with either plantar wart or callus.  He states that a couple weeks ago he went to a foot doctor and the lesions were frozen.  Then for several days (about 5/7, 5/8, and 5/9) he applied an otc topical anti-wart agent.  His heel has been sore and a little puffy since.  He is a little worried about whether there is something under a warty lesion that should be drained.  Rested his foot today and did not go into work, and needs a note.    HPI  Past Medical History:  Diagnosis Date  . H/O tooth extraction     History reviewed. No pertinent surgical history.     Home Medications    Prior to Admission medications   Medication Sig Start Date End Date Taking? Authorizing Provider  HYDROcodone-acetaminophen (NORCO/VICODIN) 5-325 MG tablet Take 1 tablet by mouth every 6 (six) hours as needed for moderate pain.   Yes [provider]  NON FORMULARY OTC wart remover-topical   Yes [provider]  ibuprofen (ADVIL,MOTRIN) 800 MG tablet Take 1 tablet (800 mg total) by mouth 3 (three) times daily. 10/02/18   Muthersbaugh, Dahlia Client, PA-C    Family History Family History  Problem Relation Age of Onset  . Healthy Mother   . Healthy Father     Social History Social History   Tobacco Use  . Smoking status: Current Every Day Smoker    Types: Cigarettes  . Smokeless tobacco: Never Used  . Tobacco comment: 1-2 cigarettes per day  Substance Use Topics  . Alcohol use: Yes    Comment: socially  . Drug use: No     Allergies   Patient has no known allergies.   Review of Systems Review of Systems  All other systems reviewed and are negative.     Physical Exam Triage Vital Signs ED Triage Vitals  Enc Vitals Group     BP 03/12/19 1844 106/69     Pulse Rate 03/12/19 1844 85     Resp 03/12/19 1844 16     Temp 03/12/19 1844 98.1 F (36.7 C)     Temp Source 03/12/19 1844 Oral     SpO2 03/12/19 1844 98 %     Weight --      Height --      Pain Score 03/12/19 1842 7     Pain Loc --    Updated Vital Signs BP 106/69 (BP Location: Left Arm)   Pulse 85   Temp 98.1 F (36.7 C) (Oral)   Resp 16   SpO2 98%  Physical Exam Vitals signs and nursing note reviewed.  Constitutional:      General: He is not in acute distress.    Comments: Alert, nicely groomed  HENT:     Head: Atraumatic.  Eyes:     Comments: Conjugate gaze, no eye redness/drainage  Neck:     Musculoskeletal: Neck supple.  Cardiovascular:     Rate and Rhythm: Normal rate.  Pulmonary:     Effort: No respiratory distress.  Abdominal:     General: There is no distension.  Musculoskeletal: Normal  range of motion.  Skin:    General: Skin is warm and dry.     Comments: No cyanosis Sole of right heel with 2 1" hyperkeratotic/hyperpigmented lesions consistent with wart or callus, and 2-3 0.5" lesions.  These are mildly tender to palpation.  Surrounding tissue is slightly puffy but no erythema.  No fluctuance appreciated.    Neurological:     Mental Status: He is alert and oriented to person, place, and time.       Final Clinical Impressions(s) / UC Diagnoses   Final diagnoses:  Warts of foot     Discharge Instructions     Warts and calluses on the soles of the feet are a difficult issue.  May require treatment every couple of weeks for as long as several months, which can be uncomfortable.  No danger signs on exam.  Might try some moleskin cushions from the drugstore, can cut out centers of the pad to relieve pressure right over the sore spots.  Followup with a foot doctor to guide further treatment.  Note for work tomorrow.   ED Prescriptions    None         Isa RankinMurray, Quavion Boule Wilson, MD 03/25/19 1622

## 2020-06-29 IMAGING — CR DG HAND COMPLETE 3+V*L*
3 series · 3 of 3 positions shown · non-contrast
Comparison: None.

CLINICAL DATA: Initial evaluation for acute pain with swelling,
trauma.

EXAM:
LEFT HAND - COMPLETE 3+ VIEW

[hand pa]
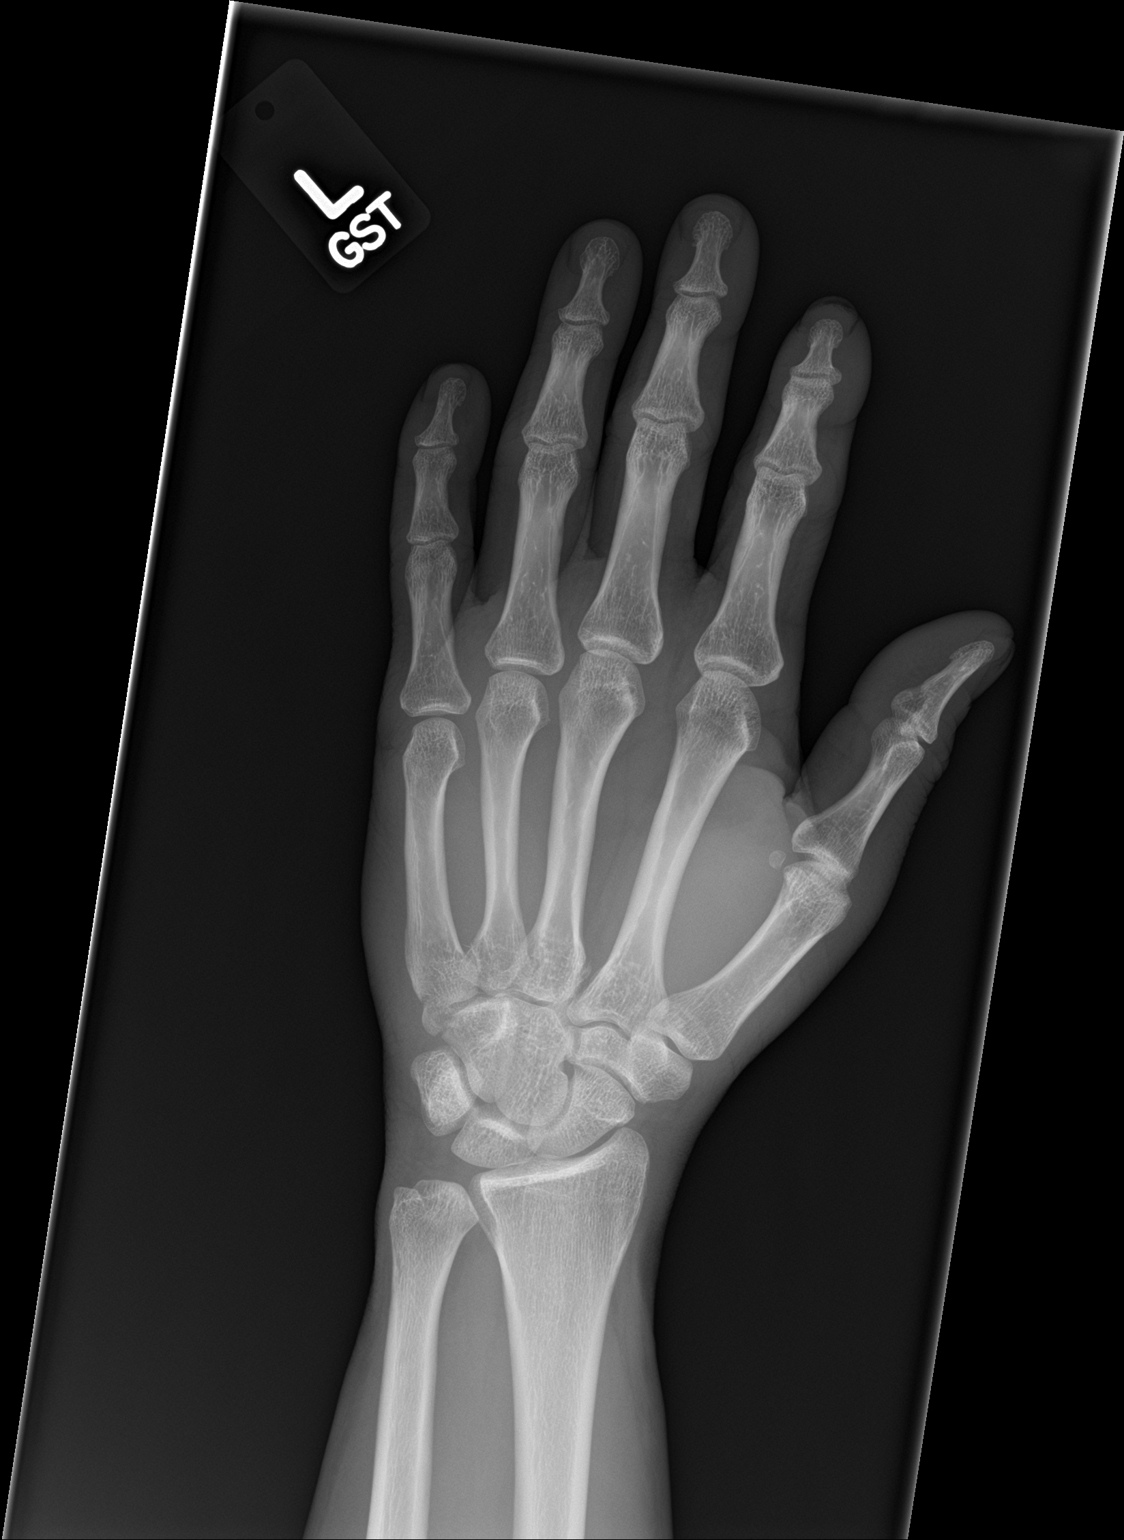

[hand obl]
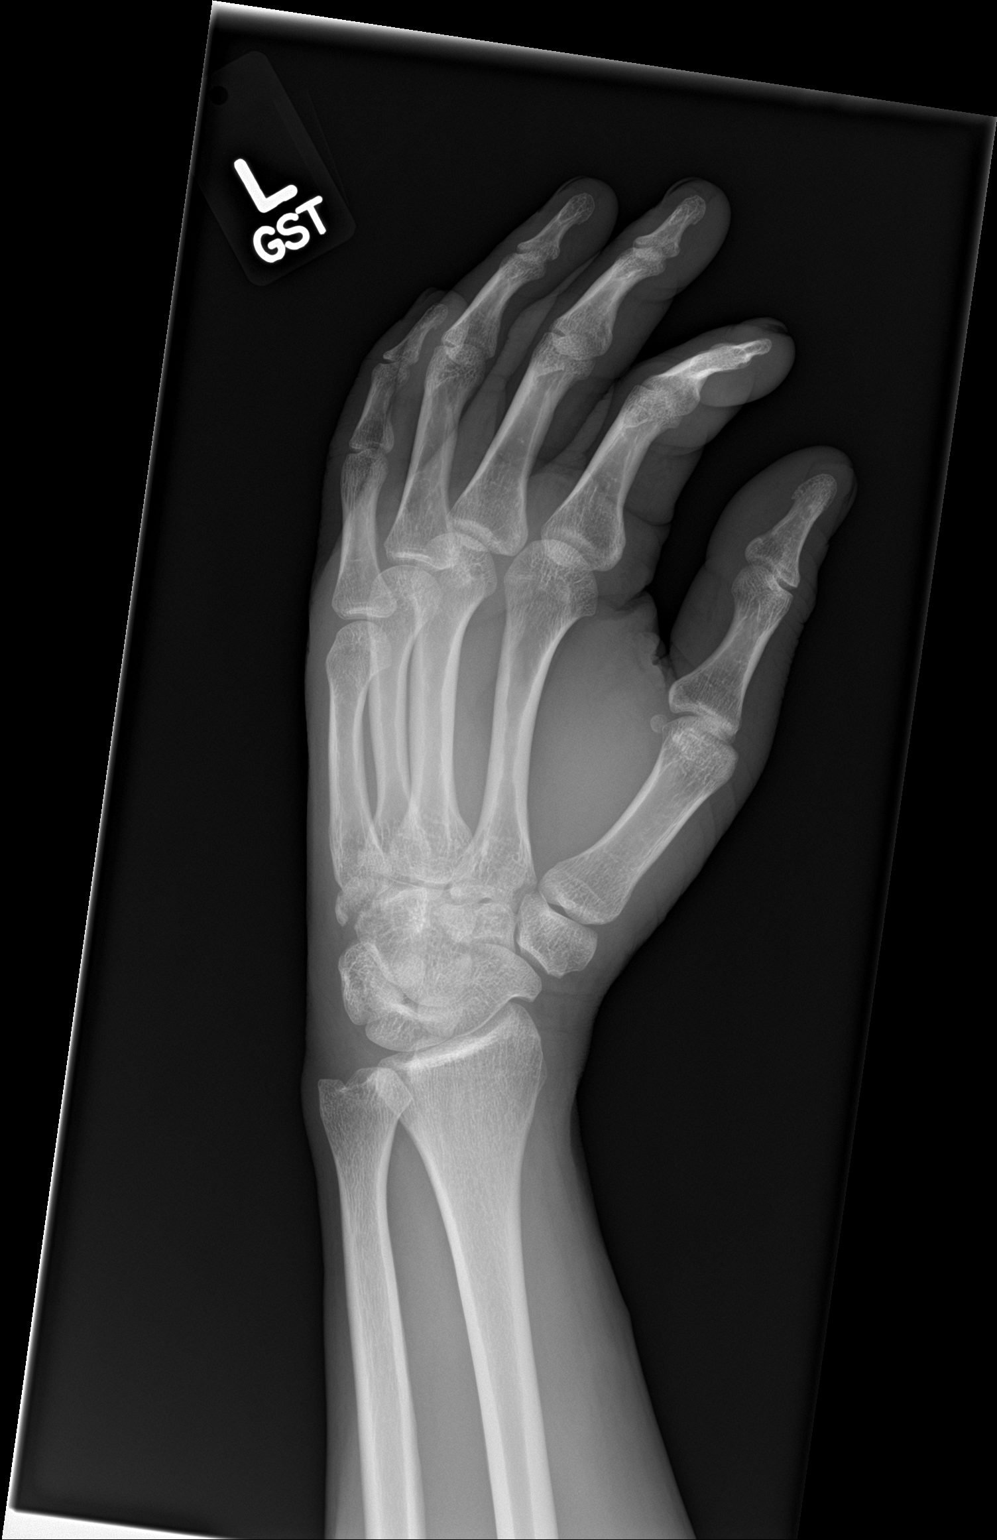

[hand lat]
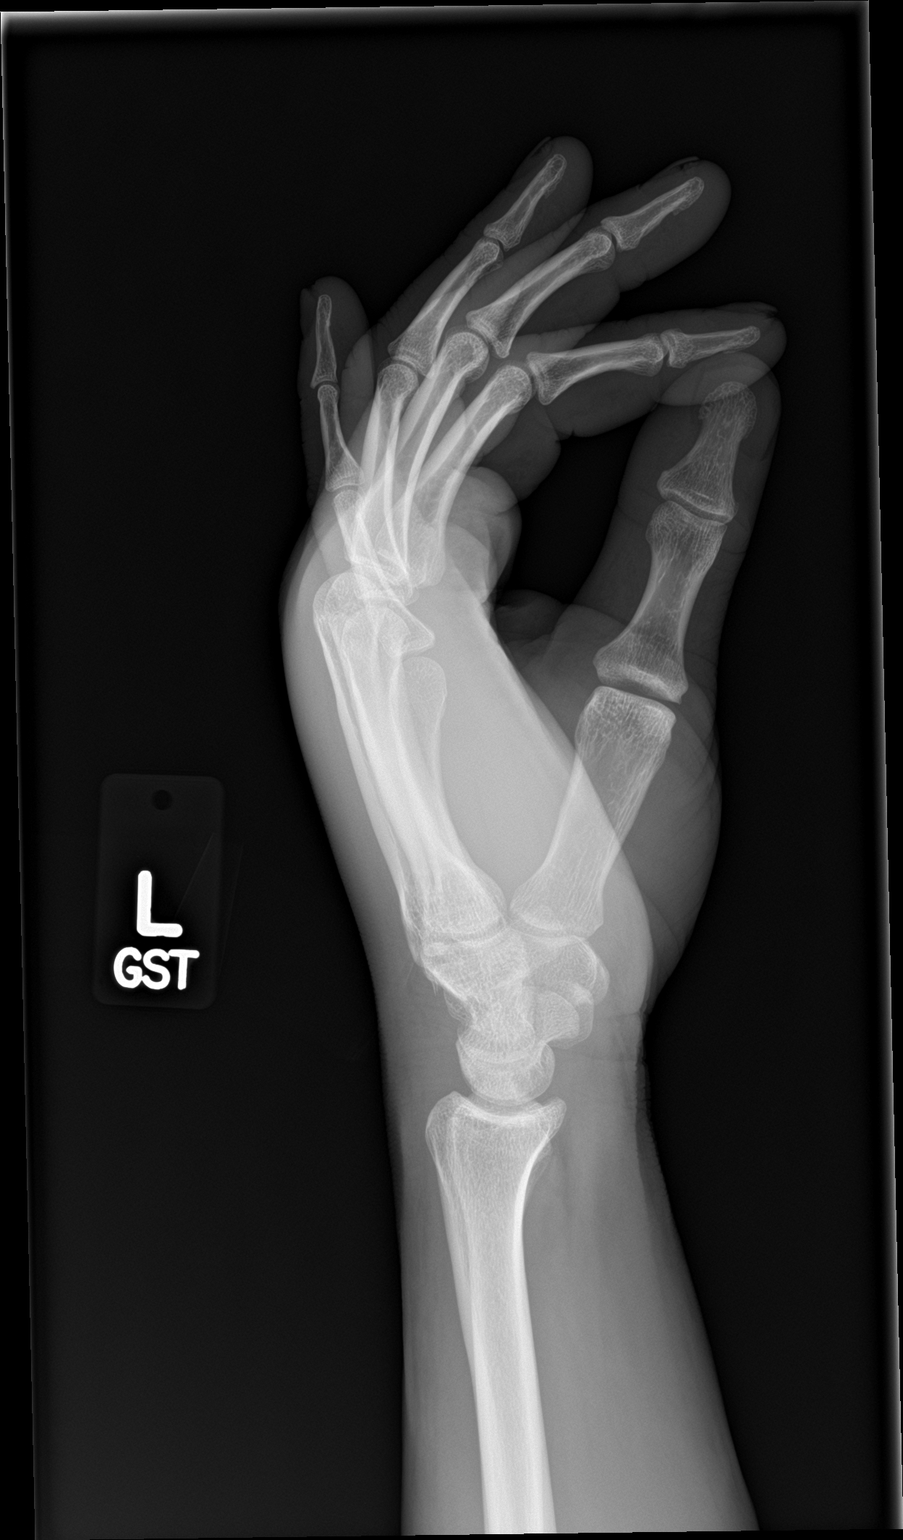

[3 of 3 positions shown; findings below may reference images not displayed]

FINDINGS: Osseous fragment at the base of the left fifth metacarpal,
suspicious for an acute minimally displaced fracture. Additional
probable tiny acute minimally displaced fracture at the base of the
left third metacarpal. No other acute fracture or dislocation. Joint
spaces maintained. Mild soft tissue swelling overlies the mid hand.
IMPRESSION: 1. Osseous fragment at the base of the left fifth metacarpal,
suspicious for an acute minimally displaced fracture.
2. Additional probable tiny acute minimally displaced fracture at
the base of the left third metacarpal.
